# Patient Record
Sex: Female | Born: 1985 | Race: White | Hispanic: No | Marital: Married | Smoking: Never smoker
Health system: Southern US, Community
[De-identification: ages and names within clinical notes are randomized; demographics above are authoritative.]

## PROBLEM LIST (undated history)

## (undated) DIAGNOSIS — C439 Malignant melanoma of skin, unspecified: Secondary | ICD-10-CM

## (undated) DIAGNOSIS — J310 Chronic rhinitis: Secondary | ICD-10-CM

## (undated) DIAGNOSIS — K52832 Lymphocytic colitis: Secondary | ICD-10-CM

## (undated) HISTORY — DX: Chronic rhinitis: J31.0

## (undated) HISTORY — PX: MELANOMA EXCISION: SHX5266

## (undated) HISTORY — DX: Lymphocytic colitis: K52.832

## (undated) HISTORY — PX: DENTAL SURGERY: SHX609

## (undated) HISTORY — DX: Malignant melanoma of skin, unspecified: C43.9

---

## 2021-02-28 LAB — OB RESULTS CONSOLE ABO/RH: RH Type: POSITIVE

## 2021-02-28 LAB — OB RESULTS CONSOLE HEPATITIS B SURFACE ANTIGEN: Hepatitis B Surface Ag: NEGATIVE

## 2021-02-28 LAB — OB RESULTS CONSOLE RUBELLA ANTIBODY, IGM: Rubella: IMMUNE

## 2021-02-28 LAB — HEPATITIS C ANTIBODY: HCV Ab: NEGATIVE

## 2021-02-28 LAB — OB RESULTS CONSOLE ANTIBODY SCREEN: Antibody Screen: NEGATIVE

## 2021-02-28 LAB — OB RESULTS CONSOLE RPR: RPR: NONREACTIVE

## 2021-02-28 LAB — OB RESULTS CONSOLE HIV ANTIBODY (ROUTINE TESTING): HIV: NONREACTIVE

## 2021-03-03 ENCOUNTER — Inpatient Hospital Stay (HOSPITAL_COMMUNITY)
Admission: AD | Admit: 2021-03-03 | Payer: BC Managed Care – PPO | Source: Home / Self Care | Admitting: Obstetrics and Gynecology

## 2021-03-08 LAB — OB RESULTS CONSOLE GC/CHLAMYDIA
Chlamydia: NEGATIVE
Neisseria Gonorrhea: NEGATIVE

## 2021-03-26 NOTE — L&D Delivery Note (Signed)
Delivery Note At 10:14 AM a viable female was delivered via ROA Presentation Apgars 9 and 9  Placenta status:spontaneously with 3 vessel cord   ,  .  Cord:   with the following complications:  loose nuchal cord x 1 .  Cord pH: not obtained Placenta sent to pathology Anesthesia: Epidural Episiotomy: None Lacerations: 2nd  Suture Repair: 3.0 chromic Est. Blood Loss (mL):  300  Mom to postpartum.  Baby to Couplet care / Skin to Skin.  Cyril Mourning 10/01/2021, 10:30 AM

## 2021-05-19 ENCOUNTER — Other Ambulatory Visit: Payer: Self-pay | Admitting: Obstetrics & Gynecology

## 2021-05-19 DIAGNOSIS — Z361 Encounter for antenatal screening for raised alphafetoprotein level: Secondary | ICD-10-CM

## 2021-05-19 DIAGNOSIS — Z363 Encounter for antenatal screening for malformations: Secondary | ICD-10-CM

## 2021-05-25 ENCOUNTER — Other Ambulatory Visit: Payer: Self-pay

## 2021-05-31 ENCOUNTER — Encounter: Payer: Self-pay | Admitting: *Deleted

## 2021-05-31 ENCOUNTER — Other Ambulatory Visit: Payer: Self-pay

## 2021-06-06 ENCOUNTER — Ambulatory Visit: Payer: BC Managed Care – PPO | Attending: Obstetrics & Gynecology

## 2021-06-06 ENCOUNTER — Ambulatory Visit: Payer: BC Managed Care – PPO | Attending: Maternal & Fetal Medicine | Admitting: Maternal & Fetal Medicine

## 2021-06-06 ENCOUNTER — Encounter: Payer: Self-pay | Admitting: *Deleted

## 2021-06-06 ENCOUNTER — Other Ambulatory Visit: Payer: Self-pay

## 2021-06-06 ENCOUNTER — Ambulatory Visit: Payer: BC Managed Care – PPO | Admitting: *Deleted

## 2021-06-06 VITALS — BP 118/65 | HR 84 | Ht 67.0 in

## 2021-06-06 DIAGNOSIS — Z361 Encounter for antenatal screening for raised alphafetoprotein level: Secondary | ICD-10-CM | POA: Diagnosis present

## 2021-06-06 DIAGNOSIS — R9389 Abnormal findings on diagnostic imaging of other specified body structures: Secondary | ICD-10-CM | POA: Diagnosis not present

## 2021-06-06 DIAGNOSIS — Z363 Encounter for antenatal screening for malformations: Secondary | ICD-10-CM | POA: Diagnosis present

## 2021-06-06 DIAGNOSIS — O283 Abnormal ultrasonic finding on antenatal screening of mother: Secondary | ICD-10-CM

## 2021-06-06 NOTE — Progress Notes (Signed)
MFM Brief Note ? ?Sarah Mccall is a 36 yo G2P1 at 23w 2d based on an EDD of 10/01/21 consistent with LMP and 8 week scan.  She is being seen today at the request of Dr. Linda Hedges regarding ultrasound findings performed in her offices. ? ?She is overall doing well without s/sx of preterm labor or preeclampsia. ? ?Single intrauterine pregnancy here for a detailed anatomy due to an outside exam observing frontal bossing and choroid plexus cyst. ?Normal anatomy with measurements consistent with dates ?There is good fetal movement and amniotic fluid volume ?Suboptimal views of the fetal anatomy were obtained secondary to fetal position. ? ?I met with Sarah Mccall and reviewed the normal nature of today's exam. ? ?She has a low risk NIPS and AFP/Horizon. ? ?I explained that we did not observe a choroid plexus cyst nor did we observe frontal bossing. We also cleared the ductus arteriosus.  ? ?I reviewed the association with frontal bossing to include skeletal dysplasia. I discussed the characteristics of lethal and non-lethal skeletal dysplasia, which include shortened long bones, small chest and possible frontal bossing and fetal growth restriction. ? ?Again we did not observe these findings today. We observed a normal profile, normal long bones, chest and normal growth. ? ?At this time no additional follow up is recommended. ? ?All questions answered.  ? ?I spent 20 minutes with > 50% in face to face consultation. ? ?Vikki Ports, MD ?

## 2021-08-31 LAB — OB RESULTS CONSOLE GBS: GBS: POSITIVE

## 2021-09-07 ENCOUNTER — Telehealth (HOSPITAL_COMMUNITY): Payer: Self-pay | Admitting: *Deleted

## 2021-09-07 NOTE — Telephone Encounter (Signed)
Preadmission screen  

## 2021-09-08 ENCOUNTER — Telehealth (HOSPITAL_COMMUNITY): Payer: Self-pay | Admitting: *Deleted

## 2021-09-08 ENCOUNTER — Encounter (HOSPITAL_COMMUNITY): Payer: Self-pay | Admitting: *Deleted

## 2021-09-08 NOTE — Telephone Encounter (Signed)
Preadmission screen  

## 2021-09-30 ENCOUNTER — Encounter (HOSPITAL_COMMUNITY): Payer: Self-pay

## 2021-09-30 ENCOUNTER — Inpatient Hospital Stay (HOSPITAL_COMMUNITY): Payer: BC Managed Care – PPO

## 2021-10-01 ENCOUNTER — Inpatient Hospital Stay (HOSPITAL_COMMUNITY)
Admission: AD | Admit: 2021-10-01 | Discharge: 2021-10-02 | DRG: 807 | Disposition: A | Discharge status: Home / Self Care | Payer: BC Managed Care – PPO | Attending: Obstetrics and Gynecology | Admitting: Obstetrics and Gynecology

## 2021-10-01 ENCOUNTER — Other Ambulatory Visit: Payer: Self-pay

## 2021-10-01 ENCOUNTER — Inpatient Hospital Stay (HOSPITAL_COMMUNITY): Payer: BC Managed Care – PPO | Admitting: Anesthesiology

## 2021-10-01 ENCOUNTER — Encounter (HOSPITAL_COMMUNITY): Payer: Self-pay | Admitting: Obstetrics and Gynecology

## 2021-10-01 DIAGNOSIS — Z3A40 40 weeks gestation of pregnancy: Secondary | ICD-10-CM

## 2021-10-01 DIAGNOSIS — O99824 Streptococcus B carrier state complicating childbirth: Secondary | ICD-10-CM | POA: Diagnosis present

## 2021-10-01 DIAGNOSIS — Z3490 Encounter for supervision of normal pregnancy, unspecified, unspecified trimester: Principal | ICD-10-CM

## 2021-10-01 DIAGNOSIS — O26893 Other specified pregnancy related conditions, third trimester: Secondary | ICD-10-CM | POA: Diagnosis present

## 2021-10-01 LAB — CBC
HCT: 39.8 % (ref 36.0–46.0)
Hemoglobin: 13.5 g/dL (ref 12.0–15.0)
MCH: 31.9 pg (ref 26.0–34.0)
MCHC: 33.9 g/dL (ref 30.0–36.0)
MCV: 94.1 fL (ref 80.0–100.0)
Platelets: 194 10*3/uL (ref 150–400)
RBC: 4.23 MIL/uL (ref 3.87–5.11)
RDW: 13.3 % (ref 11.5–15.5)
WBC: 11.3 10*3/uL — ABNORMAL HIGH (ref 4.0–10.5)
nRBC: 0 % (ref 0.0–0.2)

## 2021-10-01 LAB — TYPE AND SCREEN
ABO/RH(D): O POS
Antibody Screen: NEGATIVE

## 2021-10-01 MED ORDER — SODIUM CHLORIDE 0.9 % IV SOLN
5.0000 10*6.[IU] | Freq: Once | INTRAVENOUS | Status: AC
  Administered 2021-10-01: 5 10*6.[IU] via INTRAVENOUS
  Filled 2021-10-01: qty 5

## 2021-10-01 MED ORDER — SIMETHICONE 80 MG PO CHEW: 80.0000 mg | CHEWABLE_TABLET | ORAL | Status: DC | PRN

## 2021-10-01 MED ORDER — BUPIVACAINE HCL (PF) 0.25 % IJ SOLN
INTRAMUSCULAR | Status: DC | PRN
  Administered 2021-10-01: 10 mL via EPIDURAL

## 2021-10-01 MED ORDER — DIPHENHYDRAMINE HCL 50 MG/ML IJ SOLN: 12.5000 mg | INTRAMUSCULAR | Status: DC | PRN

## 2021-10-01 MED ORDER — ONDANSETRON HCL 4 MG/2ML IJ SOLN
4.0000 mg | Freq: Four times a day (QID) | INTRAMUSCULAR | Status: DC | PRN
  Administered 2021-10-01: 4 mg via INTRAVENOUS
  Filled 2021-10-01: qty 2

## 2021-10-01 MED ORDER — SOD CITRATE-CITRIC ACID 500-334 MG/5ML PO SOLN: 30.0000 mL | ORAL | Status: DC | PRN

## 2021-10-01 MED ORDER — FENTANYL CITRATE (PF) 100 MCG/2ML IJ SOLN
INTRAMUSCULAR | Status: AC
  Filled 2021-10-01: qty 2

## 2021-10-01 MED ORDER — MISOPROSTOL 25 MCG QUARTER TABLET: 25.0000 ug | ORAL_TABLET | ORAL | Status: DC | PRN

## 2021-10-01 MED ORDER — FLEET ENEMA 7-19 GM/118ML RE ENEM: 1.0000 | ENEMA | Freq: Every day | RECTAL | Status: DC | PRN

## 2021-10-01 MED ORDER — LACTATED RINGERS IV SOLN: 500.0000 mL | INTRAVENOUS | Status: DC | PRN

## 2021-10-01 MED ORDER — OXYTOCIN BOLUS FROM INFUSION
333.0000 mL | Freq: Once | INTRAVENOUS | Status: AC
  Administered 2021-10-01: 333 mL via INTRAVENOUS

## 2021-10-01 MED ORDER — DIBUCAINE (PERIANAL) 1 % EX OINT: 1.0000 | TOPICAL_OINTMENT | CUTANEOUS | Status: DC | PRN

## 2021-10-01 MED ORDER — LIDOCAINE HCL (PF) 1 % IJ SOLN
INTRAMUSCULAR | Status: DC | PRN
  Administered 2021-10-01: 2 mL via EPIDURAL
  Administered 2021-10-01: 10 mL via EPIDURAL

## 2021-10-01 MED ORDER — COCONUT OIL OIL: 1.0000 | TOPICAL_OIL | Status: DC | PRN

## 2021-10-01 MED ORDER — TERBUTALINE SULFATE 1 MG/ML IJ SOLN: 0.2500 mg | Freq: Once | INTRAMUSCULAR | Status: DC | PRN

## 2021-10-01 MED ORDER — OXYTOCIN-SODIUM CHLORIDE 30-0.9 UT/500ML-% IV SOLN
2.5000 [IU]/h | INTRAVENOUS | Status: DC
Start: 2021-10-01 — End: 2021-10-01
  Administered 2021-10-01: 2.5 [IU]/h via INTRAVENOUS
  Filled 2021-10-01: qty 500

## 2021-10-01 MED ORDER — LACTATED RINGERS IV SOLN
500.0000 mL | Freq: Once | INTRAVENOUS | Status: AC
Start: 2021-10-01 — End: 2021-10-01
  Administered 2021-10-01: 500 mL via INTRAVENOUS

## 2021-10-01 MED ORDER — BENZOCAINE-MENTHOL 20-0.5 % EX AERO
1.0000 | INHALATION_SPRAY | CUTANEOUS | Status: DC | PRN
  Administered 2021-10-02: 1 via TOPICAL
  Filled 2021-10-01: qty 56

## 2021-10-01 MED ORDER — OXYCODONE-ACETAMINOPHEN 5-325 MG PO TABS: 1.0000 | ORAL_TABLET | ORAL | Status: DC | PRN

## 2021-10-01 MED ORDER — DIPHENHYDRAMINE HCL 25 MG PO CAPS: 25.0000 mg | ORAL_CAPSULE | Freq: Four times a day (QID) | ORAL | Status: DC | PRN

## 2021-10-01 MED ORDER — WITCH HAZEL-GLYCERIN EX PADS: 1.0000 | MEDICATED_PAD | CUTANEOUS | Status: DC | PRN

## 2021-10-01 MED ORDER — PHENYLEPHRINE 80 MCG/ML (10ML) SYRINGE FOR IV PUSH (FOR BLOOD PRESSURE SUPPORT): 80.0000 ug | PREFILLED_SYRINGE | INTRAVENOUS | Status: DC | PRN

## 2021-10-01 MED ORDER — EPHEDRINE 5 MG/ML INJ: 10.0000 mg | INTRAVENOUS | Status: DC | PRN

## 2021-10-01 MED ORDER — ACETAMINOPHEN 325 MG PO TABS
650.0000 mg | ORAL_TABLET | ORAL | Status: DC | PRN
Start: 2021-10-01 — End: 2021-10-01

## 2021-10-01 MED ORDER — OXYTOCIN-SODIUM CHLORIDE 30-0.9 UT/500ML-% IV SOLN: 1.0000 m[IU]/min | INTRAVENOUS | Status: DC

## 2021-10-01 MED ORDER — TETANUS-DIPHTH-ACELL PERTUSSIS 5-2.5-18.5 LF-MCG/0.5 IM SUSY: 0.5000 mL | PREFILLED_SYRINGE | Freq: Once | INTRAMUSCULAR | Status: DC

## 2021-10-01 MED ORDER — IBUPROFEN 600 MG PO TABS
600.0000 mg | ORAL_TABLET | Freq: Four times a day (QID) | ORAL | Status: DC
  Administered 2021-10-01 – 2021-10-02 (×5): 600 mg via ORAL
  Filled 2021-10-01 (×5): qty 1

## 2021-10-01 MED ORDER — SENNOSIDES-DOCUSATE SODIUM 8.6-50 MG PO TABS
2.0000 | ORAL_TABLET | Freq: Every day | ORAL | Status: DC
  Administered 2021-10-02: 2 via ORAL
  Filled 2021-10-01: qty 2

## 2021-10-01 MED ORDER — BISACODYL 10 MG RE SUPP: 10.0000 mg | Freq: Every day | RECTAL | Status: DC | PRN

## 2021-10-01 MED ORDER — PENICILLIN G POT IN DEXTROSE 60000 UNIT/ML IV SOLN
3.0000 10*6.[IU] | INTRAVENOUS | Status: DC
  Administered 2021-10-01: 3 10*6.[IU] via INTRAVENOUS
  Filled 2021-10-01: qty 50

## 2021-10-01 MED ORDER — PRENATAL MULTIVITAMIN CH
1.0000 | ORAL_TABLET | Freq: Every day | ORAL | Status: DC
  Administered 2021-10-01 – 2021-10-02 (×2): 1 via ORAL
  Filled 2021-10-01 (×2): qty 1

## 2021-10-01 MED ORDER — ONDANSETRON HCL 4 MG/2ML IJ SOLN: 4.0000 mg | INTRAMUSCULAR | Status: DC | PRN

## 2021-10-01 MED ORDER — ONDANSETRON HCL 4 MG PO TABS: 4.0000 mg | ORAL_TABLET | ORAL | Status: DC | PRN

## 2021-10-01 MED ORDER — ZOLPIDEM TARTRATE 5 MG PO TABS: 5.0000 mg | ORAL_TABLET | Freq: Every evening | ORAL | Status: DC | PRN

## 2021-10-01 MED ORDER — MEDROXYPROGESTERONE ACETATE 150 MG/ML IM SUSP: 150.0000 mg | INTRAMUSCULAR | Status: DC | PRN

## 2021-10-01 MED ORDER — LIDOCAINE HCL (PF) 1 % IJ SOLN: 30.0000 mL | INTRAMUSCULAR | Status: DC | PRN

## 2021-10-01 MED ORDER — FENTANYL-BUPIVACAINE-NACL 0.5-0.125-0.9 MG/250ML-% EP SOLN
12.0000 mL/h | EPIDURAL | Status: DC | PRN
  Administered 2021-10-01: 12 mL/h via EPIDURAL
  Filled 2021-10-01: qty 250

## 2021-10-01 MED ORDER — FENTANYL CITRATE (PF) 100 MCG/2ML IJ SOLN
INTRAMUSCULAR | Status: DC | PRN
Start: 2021-10-01 — End: 2021-10-01
  Administered 2021-10-01: 100 ug via INTRAVENOUS

## 2021-10-01 MED ORDER — ACETAMINOPHEN 325 MG PO TABS
650.0000 mg | ORAL_TABLET | ORAL | Status: DC | PRN
  Administered 2021-10-02 (×3): 650 mg via ORAL
  Filled 2021-10-01 (×3): qty 2

## 2021-10-01 MED ORDER — LACTATED RINGERS IV SOLN: INTRAVENOUS | Status: DC

## 2021-10-01 MED ORDER — MEASLES, MUMPS & RUBELLA VAC IJ SOLR: 0.5000 mL | Freq: Once | INTRAMUSCULAR | Status: DC

## 2021-10-01 MED ORDER — OXYCODONE-ACETAMINOPHEN 5-325 MG PO TABS: 2.0000 | ORAL_TABLET | ORAL | Status: DC | PRN

## 2021-10-01 NOTE — Anesthesia Procedure Notes (Signed)
Epidural Patient location during procedure: OB Start time: 10/01/2021 7:33 AM End time: 10/01/2021 7:46 AM  Staffing Anesthesiologist: Pervis Hocking, DO Performed: anesthesiologist   Preanesthetic Checklist Completed: patient identified, IV checked, risks and benefits discussed, monitors and equipment checked, pre-op evaluation and timeout performed  Epidural Patient position: sitting Prep: DuraPrep and site prepped and draped Patient monitoring: continuous pulse ox, blood pressure, heart rate and cardiac monitor Approach: midline Location: L3-L4 Injection technique: LOR air  Needle:  Needle type: Tuohy  Needle gauge: 17 G Needle length: 9 cm Needle insertion depth: 5 cm Catheter type: closed end flexible Catheter size: 19 Gauge Catheter at skin depth: 10 cm Test dose: negative  Assessment Sensory level: T8 Events: blood not aspirated, injection not painful, no injection resistance, no paresthesia and negative IV test  Additional Notes Patient identified. Risks/Benefits/Options discussed with patient including but not limited to bleeding, infection, nerve damage, paralysis, failed block, incomplete pain control, headache, blood pressure changes, nausea, vomiting, reactions to medication both or allergic, itching and postpartum back pain. Confirmed with bedside nurse the patient's most recent platelet count. Confirmed with patient that they are not currently taking any anticoagulation, have any bleeding history or any family history of bleeding disorders. Patient expressed understanding and wished to proceed. All questions were answered. Sterile technique was used throughout the entire procedure. Please see nursing notes for vital signs. Test dose was given through epidural catheter and negative prior to continuing to dose epidural or start infusion. Warning signs of high block given to the patient including shortness of breath, tingling/numbness in hands, complete motor block,  or any concerning symptoms with instructions to call for help. Patient was given instructions on fall risk and not to get out of bed. All questions and concerns addressed with instructions to call with any issues or inadequate analgesia.  Reason for block:procedure for pain

## 2021-10-01 NOTE — Lactation Note (Signed)
This note was copied from a baby's chart. Lactation Consultation Note  Patient Name: Boy Dexter Sauser [id number] Date: 10/01/2021 Reason for consult: Initial assessment;Term Age:35 hours  LC in to visit with P2 Mom of term baby.  Baby "[name]" swaddled and quiet, alert in crib.  Education provided for Mom and FOB.  Baby started showing feeding cues.  Offered to assist with positioning and latching.  Mom had been using cross cradle hold, but offered to assist with football hold.  Baby latched easily with one try.  Showed FOB how to assess lower lip for flanging and wide gape of mouth.   Mom taught to use alternate breast compression and baby had identifiable swallowing.  Encouraged STS as much as possible and offering the breast with feeding cues, goal is >8 times/attempts per 24 hrs.  Encouraged Mom to call prn for assistance or concerns  Maternal Data Has patient been taught Hand Expression?: Yes Does the patient have breastfeeding experience prior to this delivery?: Yes How long did the patient breastfeed?: 6 months  Feeding Mother's Current Feeding Choice: Breast Milk  LATCH Score Latch: Grasps breast easily, tongue down, lips flanged, rhythmical sucking.  Audible Swallowing: Spontaneous and intermittent  Type of Nipple: Everted at rest and after stimulation  Comfort (Breast/Nipple): Soft / non-tender  Hold (Positioning): Assistance needed to correctly position infant at breast and maintain latch.  LATCH Score: 9   Interventions Interventions: Breast feeding basics reviewed;Assisted with latch;Skin to skin;Breast massage;Hand express;Breast compression;Adjust position;Support pillows;Position options;LC Services brochure  Discharge Pump: Employee Pump ([employer]) WIC Program: No  Consult Status Consult Status: Follow-up Date: 10/02/21 Follow-up type: In-patient    Broadus John RN [place] 10/01/2021, 3:15 PM

## 2021-10-01 NOTE — H&P (Signed)
Sarah Mccall is a 36 year old G 2 P 1001 at 40 weeks presenting for IOL. History of rapid labor last time. OB History     Gravida  2   Para  1   Term  1   Preterm      AB      Living  1      SAB      IAB      Ectopic      Multiple      Live Births  1          Past Medical History:  Diagnosis Date   Lymphocytic colitis    Melanoma ([place])    Rhinitis    Past Surgical History:  Procedure Laterality Date   DENTAL SURGERY     Dental implants   MELANOMA EXCISION     Family History: family history includes Hypertension in her father. Social History:  reports that she has never smoked. She has never used smokeless tobacco. She reports that she does not currently use alcohol. She reports that she does not use drugs.     Maternal Diabetes: No Genetic Screening: Normal Maternal Ultrasounds/Referrals: Normal Fetal Ultrasounds or other Referrals:  None Maternal Substance Abuse:  No Significant Maternal Medications:  None Significant Maternal Lab Results:  Group B Strep positive Other Comments:  None  Review of Systems  All other systems reviewed and are negative.  Maternal Medical History:  Prenatal complications: no prenatal complications   Dilation: 3.5 Effacement (%): 80 Station: -1 Exam by:: Dr Helane Rima Height '5\' 7"'$  (1.702 m), weight 84.1 kg, last menstrual period 12/25/2020. Maternal Exam:  Uterine Assessment: Contraction strength is mild.  Contraction frequency is irregular.  Abdomen: Fetal presentation: vertex   Fetal Exam Fetal State Assessment: Category I - tracings are normal.   Physical Exam Vitals and nursing note reviewed. Exam conducted with a chaperone present.  Constitutional:      Appearance: Normal appearance.  Cardiovascular:     Rate and Rhythm: Normal rate and regular rhythm.  Pulmonary:     Effort: Pulmonary effort is normal.  Neurological:     Mental Status: She is alert.     Prenatal labs: ABO, Rh: O/Positive/--  (12/06 0000) Antibody: Negative (12/06 0000) Rubella: Immune (12/06 0000) RPR: Nonreactive (12/06 0000)  HBsAg: Negative (12/06 0000)  HIV: Non-reactive (12/06 0000)  GBS:     Assessment/Plan: IUP at term IOL GBBS + Epidural Antibiotics Send placenta to pathology Anticipate NSVD   Cyril Mourning 10/01/2021, 7:02 AM

## 2021-10-01 NOTE — Lactation Note (Signed)
This note was copied from a baby's chart. Lactation Consultation Note  Patient Name: Sarah Mccall [id number] Date: 10/01/2021 Reason for consult: L&D Initial assessment;Term Age:35 hours   Initial L&D Consult:  Visited with family < 1 hour after birth 63 awake and alert; congestion noted.  Assisted to latch easily.  Observed him feeding on/off for 10 minutes prior to leaving the room.  Reassured parents that lactation services will be available on the M/B unit.  Father and one other family member present.   Maternal Data    Feeding Mother's Current Feeding Choice: Breast Milk  LATCH Score Latch: Repeated attempts needed to sustain latch, nipple held in mouth throughout feeding, stimulation needed to elicit sucking reflex.  Audible Swallowing: None  Type of Nipple: Everted at rest and after stimulation  Comfort (Breast/Nipple): Soft / non-tender  Hold (Positioning): Assistance needed to correctly position infant at breast and maintain latch.  LATCH Score: 6   Lactation Tools Discussed/Used    Interventions Interventions: Assisted with latch;Skin to skin  Discharge    Consult Status Consult Status: Follow-up from L&D Follow-up type: In-patient    Sarah Mccall 10/01/2021, 10:56 AM

## 2021-10-01 NOTE — Anesthesia Preprocedure Evaluation (Signed)
Anesthesia Evaluation  Patient identified by MRN, date of birth, ID band Patient awake    Reviewed: Allergy & Precautions, Patient's Chart, lab work & pertinent test results  Airway Mallampati: II  TM Distance: >3 FB Neck ROM: Full    Dental no notable dental hx.    Pulmonary neg pulmonary ROS,    Pulmonary exam normal breath sounds clear to auscultation       Cardiovascular negative cardio ROS Normal cardiovascular exam Rhythm:Regular Rate:Normal     Neuro/Psych negative neurological ROS  negative psych ROS   GI/Hepatic negative GI ROS, Neg liver ROS,   Endo/Other  negative endocrine ROS  Renal/GU negative Renal ROS  negative genitourinary   Musculoskeletal negative musculoskeletal ROS (+)   Abdominal   Peds negative pediatric ROS (+)  Hematology negative hematology ROS (+) Hb 13.5, plt 194   Anesthesia Other Findings   Reproductive/Obstetrics (+) Pregnancy                             Anesthesia Physical Anesthesia Plan  ASA: 2  Anesthesia Plan: Epidural   Post-op Pain Management:    Induction:   PONV Risk Score and Plan: 2  Airway Management Planned: Natural Airway  Additional Equipment: None  Intra-op Plan:   Post-operative Plan:   Informed Consent: I have reviewed the patients History and Physical, chart, labs and discussed the procedure including the risks, benefits and alternatives for the proposed anesthesia with the patient or authorized representative who has indicated his/her understanding and acceptance.       Plan Discussed with:   Anesthesia Plan Comments:         Anesthesia Quick Evaluation

## 2021-10-02 ENCOUNTER — Ambulatory Visit: Payer: Self-pay

## 2021-10-02 LAB — CBC
HCT: 38.9 % (ref 36.0–46.0)
Hemoglobin: 13.1 g/dL (ref 12.0–15.0)
MCH: 32.6 pg (ref 26.0–34.0)
MCHC: 33.7 g/dL (ref 30.0–36.0)
MCV: 96.8 fL (ref 80.0–100.0)
Platelets: 167 10*3/uL (ref 150–400)
RBC: 4.02 MIL/uL (ref 3.87–5.11)
RDW: 13.5 % (ref 11.5–15.5)
WBC: 16.8 10*3/uL — ABNORMAL HIGH (ref 4.0–10.5)
nRBC: 0 % (ref 0.0–0.2)

## 2021-10-02 LAB — RPR: RPR Ser Ql: NONREACTIVE

## 2021-10-02 NOTE — Lactation Note (Signed)
This note was copied from a baby's chart. Lactation Consultation Note  Patient Name: Boy Gustie Bobb [id number] Date: 10/02/2021 Reason for consult: Follow-up assessment;Term;Nipple pain/trauma Age:35 hours  LC in to visit with P2 Mom of term baby.  Mom has been exclusively breast feeding baby and he is at a 5.6% weight loss.  Baby's output is great.  Bilirubin low.  Mom reports baby is latching and feeding well.  Mom wanting assistance with feeding baby cross cradle hold on left breast due to some soreness.  Mom had been using a C hold on her breast.  Assisted Mom in using a U hold and baby latched deeply and easily.  Mom aware of how to assess for flanging of lower lips.  Baby noted to be  nutritive and contented.  Encouraged to keep baby STS and offer the breast often with cues. Mom to call for assistance prn.    LATCH Score Latch: Grasps breast easily, tongue down, lips flanged, rhythmical sucking.  Audible Swallowing: Spontaneous and intermittent  Type of Nipple: Everted at rest and after stimulation  Comfort (Breast/Nipple): Filling, red/small blisters or bruises, mild/mod discomfort (left breast slightly sore)  Hold (Positioning): Assistance needed to correctly position infant at breast and maintain latch.  LATCH Score: 8   Interventions Interventions: Breast feeding basics reviewed;Assisted with latch;Skin to skin;Breast massage;Adjust position;Support pillows;Breast compression;Position options   Consult Status Consult Status: Follow-up Date: 10/03/21 Follow-up type: In-patient    Broadus John 10/02/2021, 2:56 PM

## 2021-10-02 NOTE — Discharge Summary (Signed)
Postpartum Discharge Summary      Patient Name: Sarah Mccall DOB: 12-Feb-1986 MRN: [id number]  Date of admission: 10/01/2021 Delivery date:10/01/2021  Delivering provider: Dian Queen  Date of discharge: 10/02/2021  Admitting diagnosis: Pregnancy [Z34.90] NSVD (normal spontaneous vaginal delivery) [O80] Intrauterine pregnancy: [redacted]w[redacted]d    Secondary diagnosis:  Principal Problem:   Pregnancy Active Problems:   NSVD (normal spontaneous vaginal delivery)  Additional problems:      Discharge diagnosis: Term Pregnancy Delivered                                              Post partum procedures:   Augmentation: AROM Complications: None  Hospital course: Induction of Labor With Vaginal Delivery   36y.o. yo G2P2002 at 448w0das admitted to the hospital 10/01/2021 for induction of labor.  Indication for induction:  history of rapid labor .  Patient had an uncomplicated labor course as follows: Membrane Rupture Time/Date: 6:47 AM ,10/01/2021   Delivery Method:Vaginal, Spontaneous  Episiotomy: None  Lacerations:  2nd degree  Details of delivery can be found in separate delivery note.  Patient had a routine postpartum course. Patient is discharged home 10/02/21.  Newborn Data: Birth date:10/01/2021  Birth time:10:14 AM  Gender:Female  Living status:Living  Apgars:7 ,9  Weight:3550 g   Magnesium Sulfate received: No BMZ received: No Rhophylac:N/A MMR:N/A T-DaP:Given prenatally Flu: N/A Transfusion:No  Physical exam  Vitals:   10/01/21 1720 10/01/21 2033 10/02/21 0054 10/02/21 0535  BP: 109/70 106/66 114/66 116/78  Pulse: 79 87 82 69  Resp: 20 20 18 18   Temp: 98.2 F (36.8 C) 98.5 F (36.9 C) (!) 97.4 F (36.3 C) 97.7 F (36.5 C)  TempSrc: Oral Oral Oral Oral  SpO2: 98% 96% 96% 98%  Weight:      Height:       General: alert, cooperative, and no distress Lochia: appropriate Uterine Fundus: firm Incision:   DVT Evaluation: No evidence of DVT seen on physical  exam. Labs: Lab Results  Component Value Date   WBC 16.8 (H) 10/02/2021   HGB 13.1 10/02/2021   HCT 38.9 10/02/2021   MCV 96.8 10/02/2021   PLT 167 10/02/2021       No data to display         EdLesothocore:    10/02/2021    9:15 AM  Edinburgh Postnatal Depression Scale Screening Tool  I have been able to laugh and see the funny side of things. 0  I have looked forward with enjoyment to things. 0  I have blamed myself unnecessarily when things went wrong. 2  I have been anxious or worried for no good reason. 2  I have felt scared or panicky for no good reason. 2  Things have been getting on top of me. 0  I have been so unhappy that I have had difficulty sleeping. 0  I have felt sad or miserable. 1  I have been so unhappy that I have been crying. 1  The thought of harming myself has occurred to me. 0  Edinburgh Postnatal Depression Scale Total 8      After visit meds:  Allergies as of 10/02/2021       Reactions   Phenergan [promethazine Hcl] Anaphylaxis        Medication List     TAKE these medications  PRENATAL VITAMINS PO Take by mouth.         Discharge home in stable condition Infant Feeding: Breast Infant Disposition:home with mother Discharge instruction: per After Visit Summary and Postpartum booklet. Activity: Advance as tolerated. Pelvic rest for 6 weeks.  Diet: routine diet Anticipated Birth Control: Unsure Postpartum Appointment:6 weeks Additional Postpartum F/U:    Future Appointments:No future appointments. Follow up Visit:      10/02/2021 Luz Lex, MD

## 2021-10-02 NOTE — Anesthesia Postprocedure Evaluation (Signed)
Anesthesia Post Note  Patient: Sarah Mccall  Procedure(s) Performed: AN AD HOC LABOR EPIDURAL     Patient location during evaluation: Mother Baby Anesthesia Type: Epidural Level of consciousness: awake Pain management: satisfactory to patient Vital Signs Assessment: post-procedure vital signs reviewed and stable Respiratory status: spontaneous breathing Cardiovascular status: stable Anesthetic complications: no   No notable events documented.  Last Vitals:  Vitals:   10/02/21 0054 10/02/21 0535  BP: 114/66 116/78  Pulse: 82 69  Resp: 18 18  Temp: (!) 36.3 C 36.5 C  SpO2: 96% 98%    Last Pain:  Vitals:   10/02/21 0628  TempSrc:   PainSc: 6    Pain Goal: Patients Stated Pain Goal: 2 (10/02/21 3606)                 Casimer Lanius

## 2021-10-03 ENCOUNTER — Ambulatory Visit: Payer: Self-pay

## 2021-10-03 LAB — SURGICAL PATHOLOGY

## 2021-10-03 NOTE — Lactation Note (Signed)
This note was copied from a baby's chart. Lactation Consultation Note  Patient Name: Sarah Mccall [id number] Date: 10/03/2021 Reason for consult: Follow-up assessment;Term Age:34 hours   P2 mother whose infant is now 23 hours old.  This is a term baby at 40+0 weeks.  Mother's current feeding preference is breast.  Mother had just latched "[name]" prior to my arrival; questioning whether his latch looked good.  He appeared to have a good latch with a wide gape and flanged lips; no discomfort with the exception of the initial sensitivity when latching.  Observed him feeding with swallows for 10 minutes.  Mother burped and he began awakening more; latched to the alternate breast easily.  He continued to feed as I left the room.  Reviewed breast feeding basics.  Mother will call for latch assistance as needed.  Family has our OP phone number for any concerns after discharge.   Maternal Data    Feeding Mother's Current Feeding Choice: Breast Milk  LATCH Score Latch: Grasps breast easily, tongue down, lips flanged, rhythmical sucking.  Audible Swallowing: Spontaneous and intermittent  Type of Nipple: Everted at rest and after stimulation  Comfort (Breast/Nipple): Filling, red/small blisters or bruises, mild/mod discomfort  Hold (Positioning): Assistance needed to correctly position infant at breast and maintain latch.  LATCH Score: 8   Lactation Tools Discussed/Used    Interventions Interventions: Breast feeding basics reviewed;Assisted with latch;Skin to skin;Breast compression;Expressed milk;Position options;Support pillows;Adjust position;Education  Discharge Discharge Education: Engorgement and breast care Pump: Personal  Consult Status Consult Status: Complete Date: 10/03/21 Follow-up type: Call as needed    Thales Knipple R Mariesha Venturella 10/03/2021, 8:11 AM

## 2021-10-10 ENCOUNTER — Telehealth (HOSPITAL_COMMUNITY): Payer: Self-pay | Admitting: *Deleted

## 2021-10-10 NOTE — Telephone Encounter (Signed)
Left phone voicemail message.  Odis Hollingshead, RN 10-10-2021 at 10:51am

## 2022-01-01 ENCOUNTER — Encounter: Payer: Self-pay | Admitting: Gastroenterology

## 2022-01-01 ENCOUNTER — Ambulatory Visit (INDEPENDENT_AMBULATORY_CARE_PROVIDER_SITE_OTHER): Payer: BC Managed Care – PPO | Admitting: Gastroenterology

## 2022-01-01 ENCOUNTER — Other Ambulatory Visit (INDEPENDENT_AMBULATORY_CARE_PROVIDER_SITE_OTHER): Payer: BC Managed Care – PPO

## 2022-01-01 VITALS — BP 100/78 | HR 75 | Ht 67.0 in | Wt 171.4 lb

## 2022-01-01 DIAGNOSIS — K529 Noninfective gastroenteritis and colitis, unspecified: Secondary | ICD-10-CM | POA: Diagnosis not present

## 2022-01-01 DIAGNOSIS — R140 Abdominal distension (gaseous): Secondary | ICD-10-CM

## 2022-01-01 DIAGNOSIS — R12 Heartburn: Secondary | ICD-10-CM | POA: Diagnosis not present

## 2022-01-01 DIAGNOSIS — R198 Other specified symptoms and signs involving the digestive system and abdomen: Secondary | ICD-10-CM

## 2022-01-01 LAB — CBC WITH DIFFERENTIAL/PLATELET
Basophils Absolute: 0 10*3/uL (ref 0.0–0.1)
Basophils Relative: 0.3 % (ref 0.0–3.0)
Eosinophils Absolute: 0.2 10*3/uL (ref 0.0–0.7)
Eosinophils Relative: 2.3 % (ref 0.0–5.0)
HCT: 40.3 % (ref 36.0–46.0)
Hemoglobin: 13.6 g/dL (ref 12.0–15.0)
Lymphocytes Relative: 25.9 % (ref 12.0–46.0)
Lymphs Abs: 1.8 10*3/uL (ref 0.7–4.0)
MCHC: 33.7 g/dL (ref 30.0–36.0)
MCV: 94.2 fl (ref 78.0–100.0)
Monocytes Absolute: 0.5 10*3/uL (ref 0.1–1.0)
Monocytes Relative: 6.7 % (ref 3.0–12.0)
Neutro Abs: 4.4 10*3/uL (ref 1.4–7.7)
Neutrophils Relative %: 64.8 % (ref 43.0–77.0)
Platelets: 203 10*3/uL (ref 150.0–400.0)
RBC: 4.28 Mil/uL (ref 3.87–5.11)
RDW: 12.7 % (ref 11.5–15.5)
WBC: 6.9 10*3/uL (ref 4.0–10.5)

## 2022-01-01 NOTE — Progress Notes (Signed)
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  She had a colonoscopy and was told she had lymphocytic colitis, and was treated with WelChol.  She took this for some time until she finally weaned off it and says symptoms were improved.  She has always had "stomach issues" with some sensitivities to food or stress causing bloating or diarrhea.  She does not recall if she was tested for celiac at that time.  She has had recurrence of similar symptoms with generalized abdominal discomfort, bloating and urgent loose stool with mucus.  This began in the third trimester of pregnancy and has persisted since delivering her second child in July.  Some nights she needs to get up 2 or 3 times due to the diarrhea, she has a feeling of rectal pressure without rectal bleeding.  Episodes of diarrhea sometimes cause a facial flushing without lip or tongue swelling or rash.  She has not noticed any specific food triggers, and did not improve after eliminating milk. Lastly, she is losing weight more than she expected to after childbirth and while breast-feeding.  ROS:  Review of Systems  Constitutional:  Negative for appetite change and unexpected weight change.  HENT:  Negative for mouth sores and voice change.   Eyes:  Negative for pain and redness.  Respiratory:  Negative for cough and shortness of breath.   Cardiovascular:  Negative for chest pain and palpitations.   Genitourinary:  Negative for dysuria and hematuria.  Musculoskeletal:  Negative for arthralgias and myalgias.  Skin:  Negative for pallor and rash.       She has a new lower leg skin lesion that will be evaluated by dermatology soon.  Neurological:  Negative for weakness and headaches.  Hematological:  Negative for adenopathy.   Episodic heartburn and nausea Fatigue and brain fog  Past Medical History: Past Medical History:  Diagnosis Date   Lymphocytic colitis    Melanoma ([place])    Rhinitis      Past Surgical History: Past Surgical History:  Procedure Laterality Date   DENTAL SURGERY     Dental implants   MELANOMA EXCISION       Family History: Family History  Problem Relation Age of Onset   Hypertension Father     Social History: Social History   Socioeconomic History   Marital status: Married    Spouse name: Not on file   Number of children: Not on file   Years of education: Not on file   Highest education level: Not on file  Occupational History   Not on file  Tobacco Use   Smoking status: Never   Smokeless tobacco: Never  Vaping Use   Vaping Use: Never used  Substance and Sexual Activity   Alcohol use: Not Currently   Drug use: Never   Sexual activity: Yes    Birth control/protection: None  Other Topics Concern   Not on file  Social History Narrative   Not on file   Social Determinants of Health   Financial Resource Strain: Not  on file  Food Insecurity: Not on file  Transportation Needs: Not on file  Physical Activity: Not on file  Stress: Not on file  Social Connections: Not on file    Allergies: Allergies  Allergen Reactions   Phenergan [Promethazine Hcl] Anaphylaxis    Outpatient Meds: Current Outpatient Medications  Medication Sig Dispense Refill   CAMILA 0.35 MG tablet Take 1 tablet by mouth daily.     Prenatal Vit-Fe Fumarate-FA (PRENATAL VITAMINS PO) Take by mouth.     No current facility-administered medications for this  visit.      ___________________________________________________________________ Objective   Exam:  BP 100/78   Pulse 75   Ht '5\' 7"'$  (1.702 m)   Wt 171 lb 6 oz (77.7 kg)   BMI 26.84 kg/m  Wt Readings from Last 3 Encounters:  01/01/22 171 lb 6 oz (77.7 kg)  10/01/21 185 lb 4.8 oz (84.1 kg)    General: Well-appearing Eyes: sclera anicteric, no redness ENT: oral mucosa moist without lesions, no cervical or supraclavicular lymphadenopathy CV: Regular without murmur, no JVD, no peripheral edema Resp: clear to auscultation bilaterally, normal RR and effort noted GI: soft, no tenderness, with active bowel sounds. No guarding or palpable organomegaly noted. Skin; warm and dry, no rash or jaundice noted Neuro: awake, alert and oriented x 3. Normal gross motor function and fluent speech  Labs:     Latest Ref Rng & Units 10/02/2021    4:56 AM 10/01/2021    6:49 AM  CBC  WBC 4.0 - 10.5 K/uL 16.8  11.3   Hemoglobin 12.0 - 15.0 g/dL 13.1  13.5   Hematocrit 36.0 - 46.0 % 38.9  39.8   Platelets 150 - 400 K/uL 167  194    She believes her thyroid function was checked shortly before around the time of her recent delivery.    Assessment: Encounter Diagnoses  Name Primary?   Chronic diarrhea Yes   Abdominal bloating    Tenesmus     Primarily lower digestive symptoms with a reported history of lymphocytic colitis, which is an uncommon diagnosis in a young person.  Unknown if she has previously been tested for sprue. No risk factors for EPI or SIBO. The nocturnal diarrhea and greater duration of symptoms that she previously experienced when this problem flared suggested may not be simple IBS. Facial flushing with some episodes is curious, doubtful carcinoid syndrome.  She does not get hives to suggest allergic or mast cell related condition.  Plan:  Colonoscopy and EGD.  (Evaluate for IBD, biopsies for microscopic colitis, celiac sprue and eosinophilic conditions) She was agreeable  after discussion of procedure and risks.  The benefits and risks of the planned procedure were described in detail with the patient or (when appropriate) their health care proxy.  Risks were outlined as including, but not limited to, bleeding, infection, perforation, adverse medication reaction leading to cardiac or pulmonary decompensation, pancreatitis (if ERCP).  The limitation of incomplete mucosal visualization was also discussed.  No guarantees or warranties were given.  Celiac antibodies and CBC with differential today  We had considered antispasmodic medicine such as Levsin, but she opted not to since she is breast-feeding for another few months. She will continue to use Imodium as needed.   Nelida Meuse III

## 2022-01-01 NOTE — Patient Instructions (Signed)
_______________________________________________________  If you are age 35 or older, your body mass index should be between 23-30. Your Body mass index is 26.84 kg/m. If this is out of the aforementioned range listed, please consider follow up with your Primary Care Provider.  If you are age 65 or younger, your body mass index should be between 19-25. Your Body mass index is 26.84 kg/m. If this is out of the aformentioned range listed, please consider follow up with your Primary Care Provider.   ________________________________________________________  The [facility] providers would like to encourage you to use [facility] to communicate with providers for non-urgent requests or questions.  Due to long hold times on the telephone, sending your provider a message by [facility] may be a faster and more efficient way to get a response.  Please allow 48 business hours for a response.  Please remember that this is for non-urgent requests.  _______________________________________________________  Your provider has requested that you go to the basement level for lab work before leaving today. Press "B" on the elevator. The lab is located at the first door on the left as you exit the elevator.  Due to recent changes in healthcare laws, you may see the results of your imaging and laboratory studies on MyChart before your provider has had a chance to review them.  We understand that in some cases there may be results that are confusing or concerning to you. Not all laboratory results come back in the same time frame and the provider may be waiting for multiple results in order to interpret others.  Please give [place] 48 hours in order for your provider to thoroughly review all the results before contacting the office for clarification of your results.   It has been recommended to you by your physician that you have a(n) Colonoscopy completed. Per your request, we did not schedule the procedure(s) today. Please contact  our office at [phone] should you decide to have the procedure completed. You will be scheduled for a pre-visit and procedure at that time.   It was a pleasure to see you today!  Thank you for trusting me with your gastrointestinal care!

## 2022-01-02 ENCOUNTER — Telehealth: Payer: Self-pay | Admitting: Gastroenterology

## 2022-01-02 LAB — IGA: Immunoglobulin A: 182 mg/dL (ref 47–310)

## 2022-01-02 LAB — TISSUE TRANSGLUTAMINASE, IGA: (tTG) Ab, IgA: <1 U/mL

## 2022-01-02 NOTE — Telephone Encounter (Signed)
Incoming call from patient. Advised patient she could go ahead and schedule procedure. Patient states she will speak to her husband about scheduling for November Patient will call back. If needing to further advise Please give patient a call .  Thank you

## 2022-01-02 NOTE — Telephone Encounter (Signed)
Inbound call from patient requesting to speak regarding a ENDO,states that Dr. Loletha Carrow told her to get in touch with you . Please advise

## 2022-01-02 NOTE — Telephone Encounter (Signed)
Patient is requesting for you to talk with her. The dates and times that were available to her yesterday can not be scheduled today for both procedures as she needs an hour not 30 minutes.

## 2022-01-03 ENCOUNTER — Other Ambulatory Visit: Payer: Self-pay

## 2022-01-03 DIAGNOSIS — K529 Noninfective gastroenteritis and colitis, unspecified: Secondary | ICD-10-CM

## 2022-01-03 DIAGNOSIS — R12 Heartburn: Secondary | ICD-10-CM

## 2022-01-03 DIAGNOSIS — R198 Other specified symptoms and signs involving the digestive system and abdomen: Secondary | ICD-10-CM

## 2022-01-03 DIAGNOSIS — R140 Abdominal distension (gaseous): Secondary | ICD-10-CM

## 2022-01-03 MED ORDER — NA SULFATE-K SULFATE-MG SULF 17.5-3.13-1.6 GM/177ML PO SOLN: 1.0000 | Freq: Once | ORAL | 0 refills | Status: AC

## 2022-01-03 NOTE — Telephone Encounter (Signed)
Patient has been scheduled for 01-16-2022 per Dr Loletha Carrow. Instructions and prep have been sent to the patient.

## 2022-01-04 ENCOUNTER — Encounter: Payer: Self-pay | Admitting: Gastroenterology

## 2022-01-16 ENCOUNTER — Encounter: Payer: Self-pay | Admitting: Gastroenterology

## 2022-01-16 ENCOUNTER — Encounter: Payer: BC Managed Care – PPO | Admitting: Gastroenterology

## 2022-01-16 ENCOUNTER — Ambulatory Visit (AMBULATORY_SURGERY_CENTER): Payer: BC Managed Care – PPO | Admitting: Gastroenterology

## 2022-01-16 VITALS — BP 112/64 | HR 77 | Temp 98.0°F | Resp 14 | Ht 67.0 in | Wt 171.0 lb

## 2022-01-16 DIAGNOSIS — K52832 Lymphocytic colitis: Secondary | ICD-10-CM | POA: Diagnosis present

## 2022-01-16 DIAGNOSIS — R140 Abdominal distension (gaseous): Secondary | ICD-10-CM | POA: Diagnosis not present

## 2022-01-16 DIAGNOSIS — R634 Abnormal weight loss: Secondary | ICD-10-CM

## 2022-01-16 DIAGNOSIS — K6389 Other specified diseases of intestine: Secondary | ICD-10-CM | POA: Diagnosis not present

## 2022-01-16 DIAGNOSIS — R197 Diarrhea, unspecified: Secondary | ICD-10-CM | POA: Diagnosis not present

## 2022-01-16 DIAGNOSIS — K529 Noninfective gastroenteritis and colitis, unspecified: Secondary | ICD-10-CM

## 2022-01-16 LAB — POCT URINE PREGNANCY: Preg Test, Ur: NEGATIVE

## 2022-01-16 MED ORDER — SODIUM CHLORIDE 0.9 % IV SOLN: 500.0000 mL | Freq: Once | INTRAVENOUS | Status: DC

## 2022-01-16 NOTE — Op Note (Signed)
[facility] Patient Name: Sarah Mccall Procedure Date: 01/16/2022 3:54 PM MRN: [id number] Endoscopist: [place]. Loletha Carrow , MD Age: 36 Referring MD:  Date of Birth: 06-23-1985 Gender: Female Account #: [id number] Procedure:                Upper GI endoscopy Indications:              Abdominal bloating, Diarrhea, Weight loss                           recent negative tTG Ab Medicines:                Monitored Anesthesia Care Procedure:                Pre-Anesthesia Assessment:                           - Prior to the procedure, a History and Physical                            was performed, and patient medications and                            allergies were reviewed. The patient's tolerance of                            previous anesthesia was also reviewed. The risks                            and benefits of the procedure and the sedation                            options and risks were discussed with the patient.                            All questions were answered, and informed consent                            was obtained. Prior Anticoagulants: The patient has                            taken no previous anticoagulant or antiplatelet                            agents. ASA Grade Assessment: II - A patient with                            mild systemic disease. After reviewing the risks                            and benefits, the patient was deemed in                            satisfactory condition to undergo the procedure.  After obtaining informed consent, the endoscope was                            passed under direct vision. Throughout the                            procedure, the patient's blood pressure, pulse, and                            oxygen saturations were monitored continuously. The                            Endoscope was introduced through the mouth, and                            advanced to the second part of duodenum.  The upper                            GI endoscopy was accomplished without difficulty.                            The patient tolerated the procedure well. Scope In: Scope Out: Findings:                 The larynx was normal.                           The esophagus was normal.                           The stomach was normal.                           The cardia and gastric fundus were normal on                            retroflexion.                           Normal mucosa was found in the entire duodenum. Six                            biopsies for histology were taken with a cold                            forceps for evaluation of celiac disease and other                            causes of malabsorption. Complications:            No immediate complications. Estimated Blood Loss:     Estimated blood loss was minimal. Impression:               - Normal larynx.                           - Normal esophagus.                           -  Normal stomach.                           - Normal mucosa was found in the entire examined                            duodenum. Biopsied. Recommendation:           - Patient has a contact number available for                            emergencies. The signs and symptoms of potential                            delayed complications were discussed with the                            patient. Return to normal activities tomorrow.                            Written discharge instructions were provided to the                            patient.                           - Resume previous diet.                           - Continue present medications.                           - Await pathology results.                           - See the other procedure note for documentation of                            additional recommendations. Eliabeth Shoff L. Loletha Carrow, MD 01/16/2022 4:28:38 PM This report has been signed electronically.

## 2022-01-16 NOTE — Progress Notes (Unsigned)
No changes to clinical history since GI office visit on 01/01/22.  The patient is appropriate for an endoscopic procedure in the ambulatory setting.  - Salmaan Patchin Danis, MD    

## 2022-01-16 NOTE — Progress Notes (Unsigned)
A/ox3, pleased with MAC, report to RN 

## 2022-01-16 NOTE — Progress Notes (Signed)
Pt's states no medical or surgical changes since previsit or office visit. 

## 2022-01-16 NOTE — Op Note (Signed)
[place] Patient Name: Sarah Mccall Procedure Date: 01/16/2022 3:50 PM MRN: [id number] Endoscopist: Mallie Mussel L. Loletha Carrow , MD Age: 36 Referring MD:  Date of Birth: 1985/05/17 Gender: Female Account #: [id number] Procedure:                Colonoscopy Indications:              Chronic diarrhea, Weight loss, bloating                           prior Hx lymphocytic colitis (see recent office                            consult note for clinical details) Medicines:                Monitored Anesthesia Care Procedure:                Pre-Anesthesia Assessment:                           - Prior to the procedure, a History and Physical                            was performed, and patient medications and                            allergies were reviewed. The patient's tolerance of                            previous anesthesia was also reviewed. The risks                            and benefits of the procedure and the sedation                            options and risks were discussed with the patient.                            All questions were answered, and informed consent                            was obtained. Prior Anticoagulants: The patient has                            taken no previous anticoagulant or antiplatelet                            agents. ASA Grade Assessment: II - A patient with                            mild systemic disease. After reviewing the risks                            and benefits, the patient was deemed in  satisfactory condition to undergo the procedure.                           After obtaining informed consent, the colonoscope                            was passed under direct vision. Throughout the                            procedure, the patient's blood pressure, pulse, and                            oxygen saturations were monitored continuously. The                            Olympus Colonoscope [id number] was  introduced through                            the anus and advanced to the the terminal ileum,                            with identification of the appendiceal orifice and                            IC valve. The [id number]-[id number] Colonoscope was introduced                            through the and advanced to the. The colonoscopy                            was performed without difficulty. The patient                            tolerated the procedure well. The quality of the                            bowel preparation was excellent. The terminal                            ileum, ileocecal valve, appendiceal orifice, and                            rectum were photographed. The bowel preparation                            used was SUPREP. Scope In: 4:09:00 PM Scope Out: 4:23:32 PM Scope Withdrawal Time: 0 hours 11 minutes 39 seconds  Total Procedure Duration: 0 hours 14 minutes 32 seconds  Findings:                 The perianal and digital rectal examinations were                            normal.  The terminal ileum appeared normal.(intubated at                            least 10cm)                           Normal mucosa was found in the entire colon.                            Biopsies for histology were taken with a cold                            forceps from the ascending colon, transverse colon                            and descending colon for evaluation of microscopic                            colitis.                           The entire examined colon appeared normal on direct                            and retroflexion views. Complications:            No immediate complications. Estimated Blood Loss:     Estimated blood loss was minimal. Impression:               - The examined portion of the ileum was normal.                           - Normal mucosa in the entire examined colon.                            Biopsied.                           - The  entire examined colon is normal on direct and                            retroflexion views. Recommendation:           - Patient has a contact number available for                            emergencies. The signs and symptoms of potential                            delayed complications were discussed with the                            patient. Return to normal activities tomorrow.                            Written discharge instructions were provided to the  patient.                           - Resume previous diet.                           - Continue present medications.                           - Await pathology results.                           - No recommendation at this time regarding repeat                            colonoscopy.                           - See the other procedure note for documentation of                            additional recommendations.                           - As-needed use of imodium. (patient has elected                            not to try antispasmodic at present due to                            breastfeeding) Mallie Mussel L. Loletha Carrow, MD 01/16/2022 4:33:13 PM This report has been signed electronically.

## 2022-01-16 NOTE — Patient Instructions (Signed)
Please read handouts provided. Continue present medications. Await pathology results.    YOU HAD AN ENDOSCOPIC PROCEDURE TODAY AT THE [facility]:   Refer to the procedure report that was given to you for any specific questions about what was found during the examination.  If the procedure report does not answer your questions, please call your gastroenterologist to clarify.  If you requested that your care partner not be given the details of your procedure findings, then the procedure report has been included in a sealed envelope for you to review at your convenience later.  YOU SHOULD EXPECT: Some feelings of bloating in the abdomen. Passage of more gas than usual.  Walking can help get rid of the air that was put into your GI tract during the procedure and reduce the bloating. If you had a lower endoscopy (such as a colonoscopy or flexible sigmoidoscopy) you may notice spotting of blood in your stool or on the toilet paper. If you underwent a bowel prep for your procedure, you may not have a normal bowel movement for a few days.  Please Note:  You might notice some irritation and congestion in your nose or some drainage.  This is from the oxygen used during your procedure.  There is no need for concern and it should clear up in a day or so.  SYMPTOMS TO REPORT IMMEDIATELY:  Following lower endoscopy (colonoscopy or flexible sigmoidoscopy):  Excessive amounts of blood in the stool  Significant tenderness or worsening of abdominal pains  Swelling of the abdomen that is new, acute  Fever of 100F or higher  Following upper endoscopy (EGD)  Vomiting of blood or coffee ground material  New chest pain or pain under the shoulder blades  Painful or persistently difficult swallowing  New shortness of breath  Fever of 100F or higher  Black, tarry-looking stools  For urgent or emergent issues, a gastroenterologist can be reached at any hour by calling [phone]. Do not use  MyChart messaging for urgent concerns.    DIET:  We do recommend a small meal at first, but then you may proceed to your regular diet.  Drink plenty of fluids but you should avoid alcoholic beverages for 24 hours.  ACTIVITY:  You should plan to take it easy for the rest of today and you should NOT DRIVE or use heavy machinery until tomorrow (because of the sedation medicines used during the test).    FOLLOW UP: Our staff will call the number listed on your records the next business day following your procedure.  We will call around 7:15- 8:00 am to check on you and address any questions or concerns that you may have regarding the information given to you following your procedure. If we do not reach you, we will leave a message.     If any biopsies were taken you will be contacted by phone or by letter within the next 1-3 weeks.  Please call us at [phone] if you have not heard about the biopsies in 3 weeks.    SIGNATURES/CONFIDENTIALITY: You and/or your care partner have signed paperwork which will be entered into your electronic medical record.  These signatures attest to the fact that that the information above on your After Visit Summary has been reviewed and is understood.  Full responsibility of the confidentiality of this discharge information lies with you and/or your care-partner. 

## 2022-01-16 NOTE — Progress Notes (Unsigned)
Vaishnavi Dalby CRNA relieves [employer]

## 2022-01-16 NOTE — Progress Notes (Signed)
Called to room to assist during endoscopic procedure.  Patient ID and intended procedure confirmed with present staff. Received instructions for my participation in the procedure from the performing physician.  

## 2022-01-17 ENCOUNTER — Telehealth: Payer: Self-pay

## 2022-01-17 NOTE — Telephone Encounter (Signed)
  Follow up Call-     01/16/2022    2:58 PM  Call back number  Post procedure Call Back phone  # [phone]  Permission to leave phone message Yes     Patient questions:  Do you have a fever, pain , or abdominal swelling? No. Pain Score  0 *  Have you tolerated food without any problems? Yes.    Have you been able to return to your normal activities? Yes.    Do you have any questions about your discharge instructions: Diet   No. Medications  No. Follow up visit  No.  Do you have questions or concerns about your Care? No.  Actions: * If pain score is 4 or above: No action needed, pain <4.

## 2022-01-22 ENCOUNTER — Encounter: Payer: Self-pay | Admitting: Gastroenterology

## 2022-01-22 MED ORDER — BUDESONIDE 3 MG PO CPEP: 9.0000 mg | ORAL_CAPSULE | Freq: Every day | ORAL | 1 refills | Status: DC

## 2022-02-14 ENCOUNTER — Other Ambulatory Visit: Payer: Self-pay | Admitting: Gastroenterology

## 2022-06-07 ENCOUNTER — Encounter: Payer: Self-pay | Admitting: Gastroenterology

## 2022-08-26 ENCOUNTER — Other Ambulatory Visit: Payer: Self-pay | Admitting: Gastroenterology

## 2022-10-31 ENCOUNTER — Encounter: Payer: Self-pay | Admitting: Gastroenterology

## 2022-10-31 DIAGNOSIS — K529 Noninfective gastroenteritis and colitis, unspecified: Secondary | ICD-10-CM

## 2022-10-31 MED ORDER — BUDESONIDE 3 MG PO CPEP: 3.0000 mg | ORAL_CAPSULE | Freq: Every day | ORAL | 1 refills | Status: DC

## 2022-11-12 NOTE — Telephone Encounter (Signed)
Thanks for taking this message while I was out of the office.  If this diarrhea is from her known microscopic colitis, I would have hoped for improvement by now.  Might still take time or we need to add another medicine.  Can't go up on budesonide dose.  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  I am out of the office this week on hospital inpatient service.   Please arrange a C difficile PCR stool study this week.  Prn imodium ok, especially before bedtime.  - HD

## 2022-11-12 NOTE — Addendum Note (Signed)
Addended by: Missy Sabins on: 11/12/2022 08:40 AM   Modules accepted: Orders

## 2022-11-22 ENCOUNTER — Encounter: Payer: Self-pay | Admitting: Gastroenterology

## 2022-11-22 ENCOUNTER — Other Ambulatory Visit: Payer: BC Managed Care – PPO

## 2022-11-22 DIAGNOSIS — K529 Noninfective gastroenteritis and colitis, unspecified: Secondary | ICD-10-CM

## 2022-11-22 DIAGNOSIS — K52839 Microscopic colitis, unspecified: Secondary | ICD-10-CM

## 2022-11-22 NOTE — Telephone Encounter (Signed)
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  If she does not take Imodium a few times a day she will have 2-4 loose stools, and she is still often having 2-3 episodes of overnight diarrhea if she does not take Imodium at bedtime.  No fever or bloody stool, no sick household contacts.  Husband and children do not have diarrhea.  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  No apparent risk factors for EPI or SIBO.  Plan as follows:  1 - Please contact the lab and change her stool study order from just the C. difficile studies to a full [facility] GI pathogen panel.  She asked if different specimen containers were needed because she has not yet brought the specimen and, but I do not know the answer to that.  Please find that out and contact her with the arrangements.  2 - She will stop taking Entocort but will continue Imodium since it is working well, even though the diarrhea recurs when she periodically holds the medication.  3 - Please send a prescription for Uceris 9 mg once daily to start as soon as it is available.  If the pharmacy wants to switch to the generic Ortikos at the same dose, that is acceptable.  If it is prohibitively costly, she can shop it around to some other pharmacies.  This form of budesonide might work better than the Entocort that primarily works within the right colon.   - H. Danis

## 2022-11-23 ENCOUNTER — Other Ambulatory Visit: Payer: BC Managed Care – PPO

## 2022-11-23 ENCOUNTER — Other Ambulatory Visit: Payer: Self-pay | Admitting: Gastroenterology

## 2022-11-23 DIAGNOSIS — K52839 Microscopic colitis, unspecified: Secondary | ICD-10-CM

## 2022-11-23 DIAGNOSIS — K529 Noninfective gastroenteritis and colitis, unspecified: Secondary | ICD-10-CM

## 2022-11-23 MED ORDER — BUDESONIDE ER 9 MG PO TB24: 1.0000 | ORAL_TABLET | Freq: Every day | ORAL | 2 refills | Status: DC

## 2022-11-23 NOTE — Addendum Note (Signed)
Addended by: Missy Sabins on: 11/23/2022 09:41 AM   Modules accepted: Orders

## 2022-11-24 LAB — CLOSTRIDIUM DIFFICILE BY PCR: Toxigenic C. Difficile by PCR: NEGATIVE

## 2022-11-27 ENCOUNTER — Other Ambulatory Visit (HOSPITAL_COMMUNITY): Payer: Self-pay

## 2022-11-27 ENCOUNTER — Telehealth: Payer: Self-pay

## 2022-11-27 NOTE — Telephone Encounter (Signed)
PA request has been Submitted. New Encounter created for follow up. For additional info see Pharmacy Prior Auth telephone encounter from 09/03.

## 2022-11-27 NOTE — Telephone Encounter (Signed)
*  Gastro  Pharmacy Patient Advocate Encounter   Received notification from CoverMyMeds that prior authorization for Budesonide ER 9MG  er tablets  is required/requested.   Insurance verification completed.   The patient is insured through [facility] .   Per test claim: PA required; PA submitted to [employer] via CoverMyMeds Key/confirmation #/EOC [id number] Status is pending

## 2022-11-28 NOTE — Telephone Encounter (Signed)
Additional info PA form attached to patients media.

## 2022-11-28 NOTE — Telephone Encounter (Signed)
PA form completed and faxed to [facility]

## 2022-11-29 NOTE — Telephone Encounter (Signed)
Inbound call from [facility] regarding an update for prior auth. States prior Berkley Harvey has been denied and will be faxing a detailed denial letter. Please advise, thank you.

## 2022-12-03 ENCOUNTER — Encounter: Payer: Self-pay | Admitting: Gastroenterology

## 2022-12-03 NOTE — Telephone Encounter (Signed)
Thank you for the note.  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  Thank you   - HD

## 2022-12-03 NOTE — Telephone Encounter (Signed)
Pharmacy Patient Advocate Encounter  Received notification from [facility] that Prior Authorization for Budesonide ER 9MG  er tablets has been DENIED.  Full denial letter will be uploaded to the media tab. See denial reason below.  This medication is not on the formulary. The member must try and fail (did not work), or be unable to take ALL formulary alternatives (due to interactions, side effects, etc.). In this case, other formulary alternatives are available for the member to take. Alternative medications include (please refer to member's formulary): generic Budesonide DR 3 mg capsule, mesalamine 800 mg DR tablet, mesalamine 1.2 gm tablet, mesalamine enema, mesalamine suppository, etc.  PA #/Case ID/Reference #: [id number]  Please be advised we currently do not have a Pharmacist to review denials, therefore you will need to process appeals accordingly as needed. Thanks for your support at this time. Contact for appeals are as follows: Phone: [phone], Fax: [phone]  Last day to appeal is 180 Calendar Days of the date of letter (11-29-2022)

## 2022-12-04 MED ORDER — MESALAMINE 1.2 G PO TBEC: 3.6000 g | DELAYED_RELEASE_TABLET | Freq: Every day | ORAL | 1 refills | Status: DC

## 2022-12-31 ENCOUNTER — Other Ambulatory Visit: Payer: Self-pay | Admitting: Gastroenterology

## 2023-01-29 ENCOUNTER — Other Ambulatory Visit: Payer: Self-pay | Admitting: Gastroenterology

## 2023-01-29 ENCOUNTER — Encounter: Payer: Self-pay | Admitting: Gastroenterology

## 2023-01-29 DIAGNOSIS — K52839 Microscopic colitis, unspecified: Secondary | ICD-10-CM

## 2023-01-29 DIAGNOSIS — K529 Noninfective gastroenteritis and colitis, unspecified: Secondary | ICD-10-CM

## 2023-02-04 MED ORDER — BUDESONIDE ER 9 MG PO TB24
1.0000 | ORAL_TABLET | Freq: Every day | ORAL | 2 refills | Status: DC
Start: 2023-02-04 — End: 2023-08-16

## 2023-03-07 ENCOUNTER — Other Ambulatory Visit: Payer: Self-pay | Admitting: Gastroenterology

## 2023-03-12 ENCOUNTER — Encounter: Payer: Self-pay | Admitting: Gastroenterology

## 2023-04-01 ENCOUNTER — Other Ambulatory Visit: Payer: Self-pay | Admitting: Gastroenterology

## 2023-07-04 IMAGING — US US MFM OB DETAIL+14 WK
1 series · 13 of 28 positions shown · non-contrast
Comparison: none

[Series 1: us mfm ob detail+14 wk · 110 acquisitions, 13 frames shown]
[im 5/110]
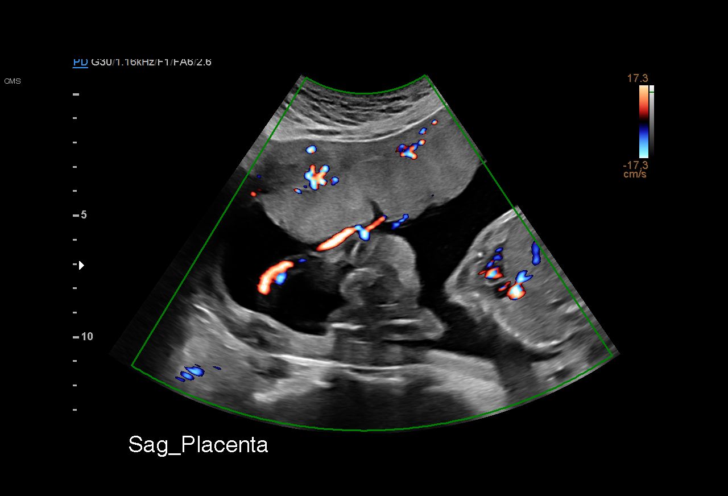
[im 13/110]
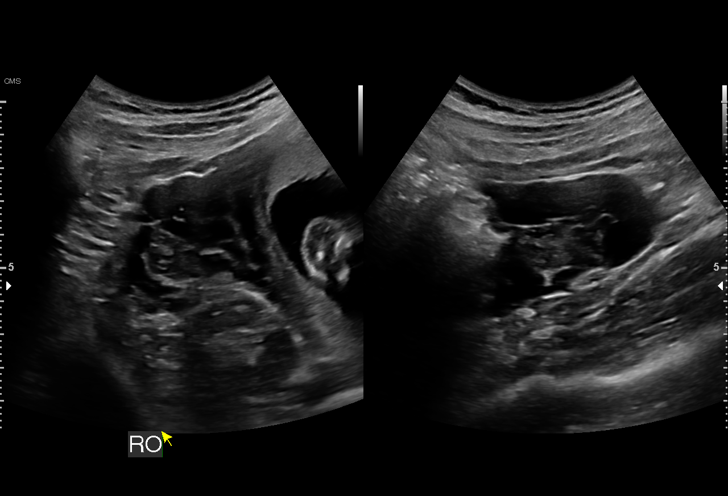
[im 21/110]
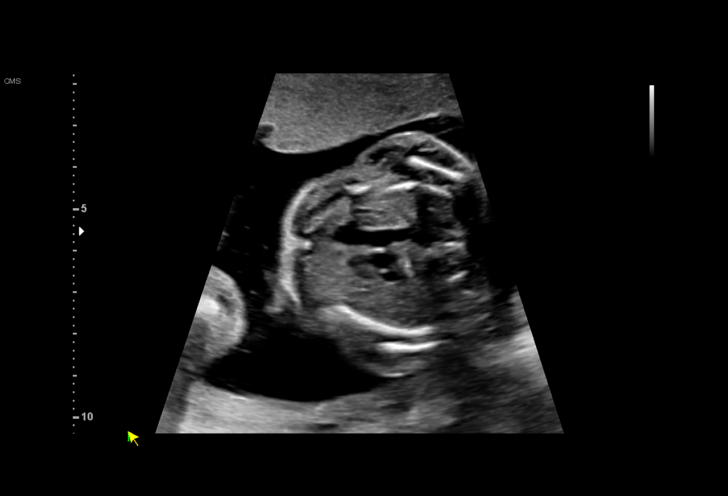
[im 29/110]
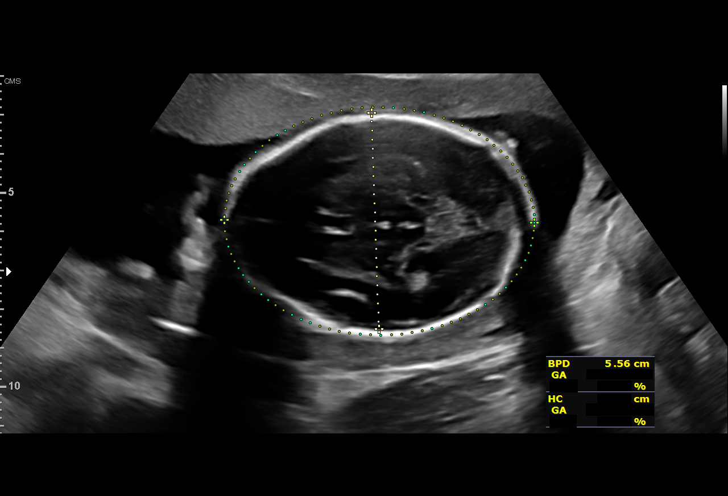
[im 37/110]
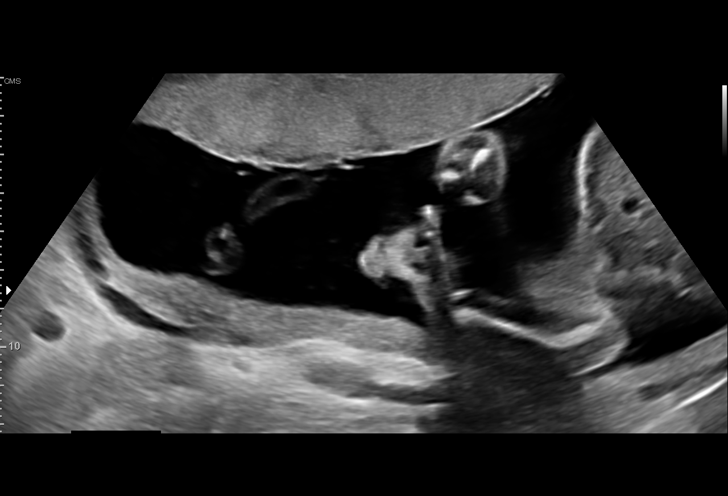
[im 45/110]
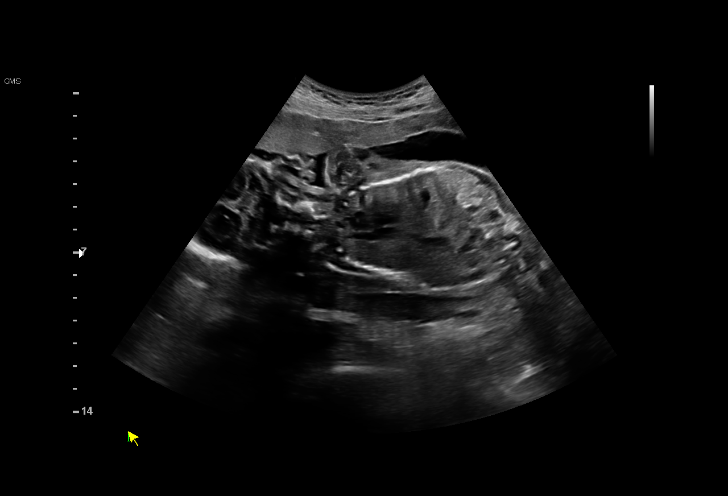
[im 57/110]
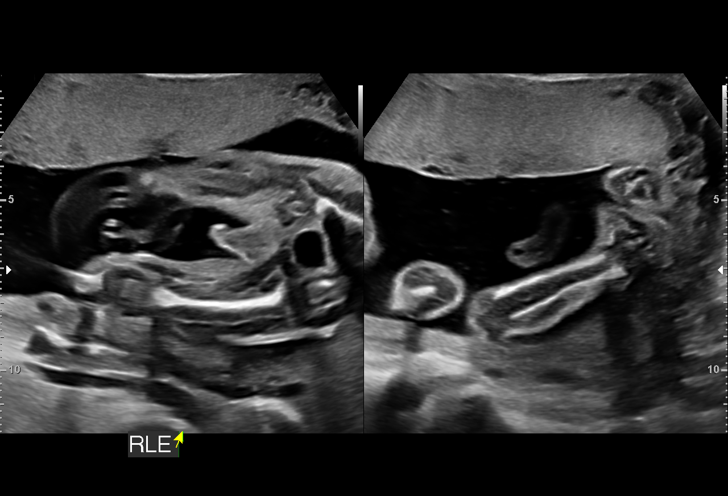
[im 65/110]
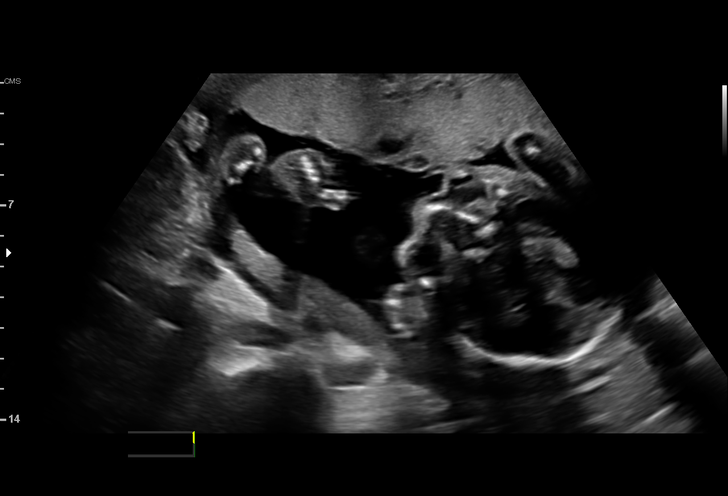
[im 73/110]
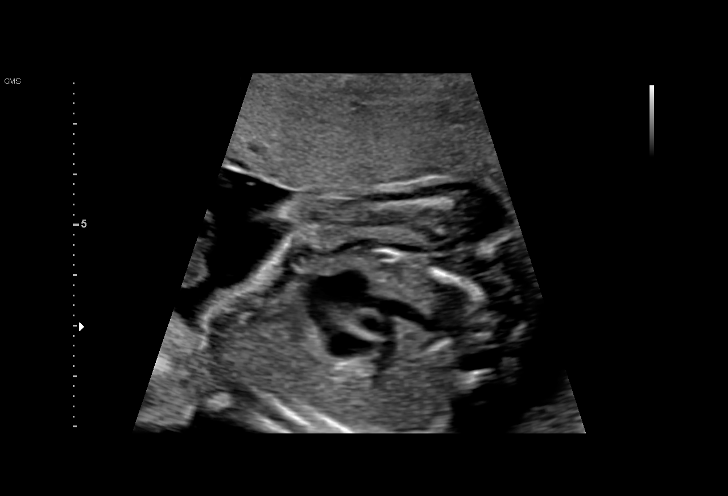
[im 81/110]
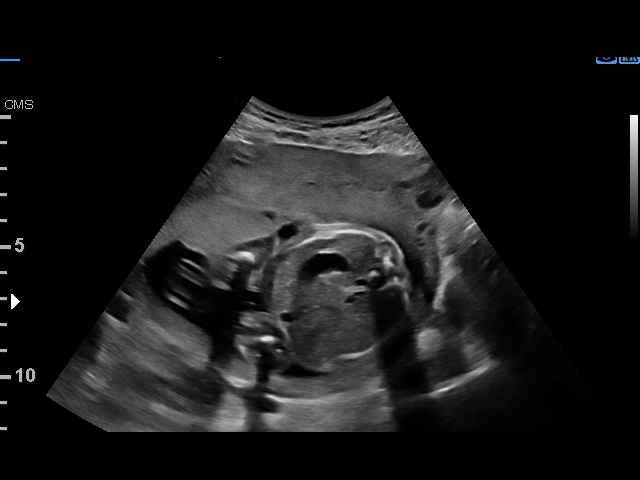
[im 89/110]
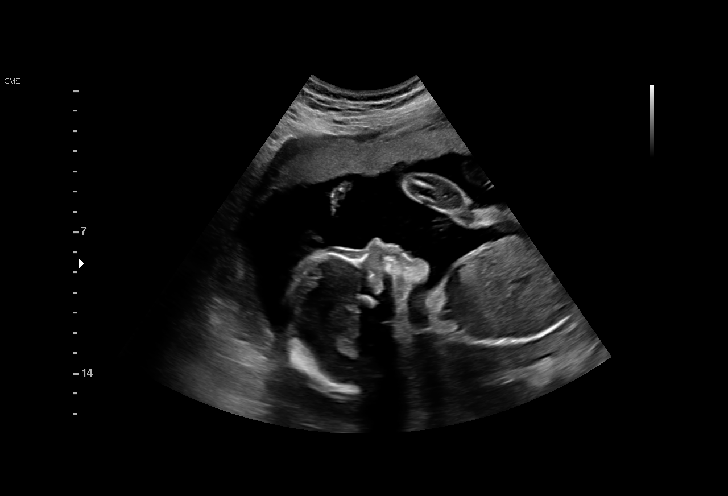
[im 97/110]
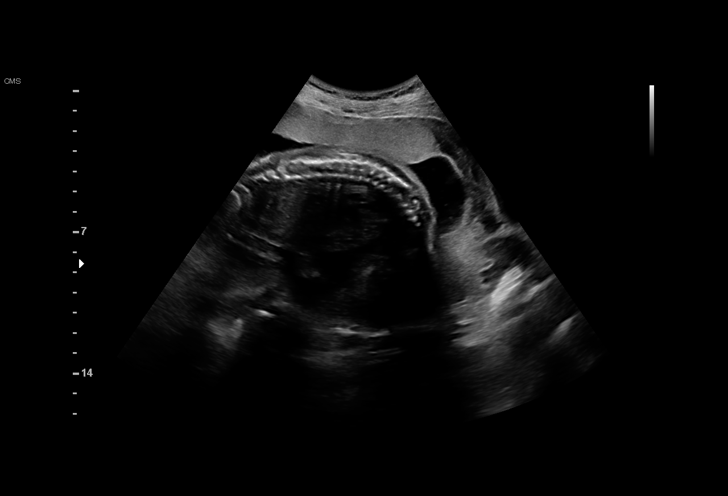
[im 105/110]
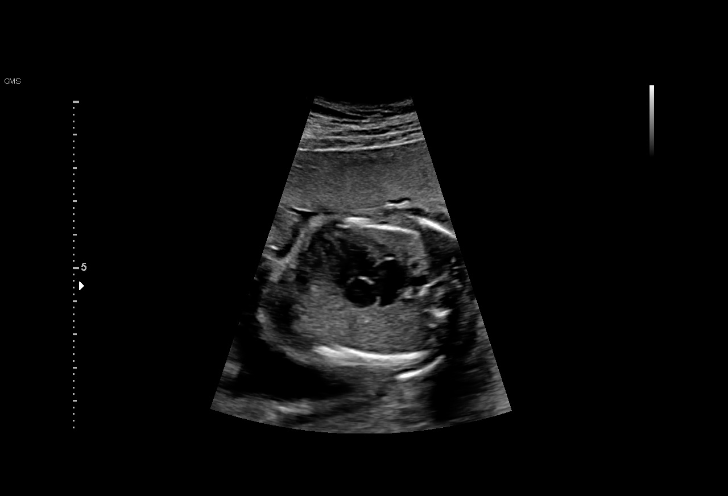

[13 of 28 positions shown; findings below may reference images not displayed]

[REDACTED]. [HOSPITAL]
                   DO

Indications

 23 weeks gestation of pregnancy
 Encounter for antenatal screening for
 malformations
 Fetal abnormality - other known or
 suspected ([facility])
 Advanced maternal age multigravida 35+,
 second trimester (36 by delivery)
 Low risk NIPS, neg AFP/Horizon
Fetal Evaluation

 Num Of Fetuses:         1
 Fetal Heart Rate(bpm):  147
 Cardiac Activity:       Observed
 Presentation:           Breech
 Placenta:               Anterior
 P. Cord Insertion:      Visualized

 Amniotic Fluid
 AFI FV:      Within normal limits

                             Largest Pocket(cm)

Biometry

 BPD:      55.4  mm     G. Age:  22w 6d         30  %    CI:        66.64   %    70 - 86
                                                         FL/HC:      17.6   %    19.2 -
 HC:      217.5  mm     G. Age:  23w 6d         55  %    HC/AC:      1.12        1.05 -
 AC:      194.1  mm     G. Age:  24w 1d         68  %    FL/BPD:     69.1   %    71 - 87
 FL:       38.3  mm     G. Age:  22w 2d         12  %    FL/AC:      19.7   %    20 - 24
 HUM:        37  mm     G. Age:  22w 6d         35  %
 CER:      24.6  mm     G. Age:  22w 4d         51  %

 LV:        8.2  mm
 CM:        5.4  mm

 Est. FW:     585  gm      1 lb 5 oz     45  %
OB History

 Gravidity:    2         Term:   1        Prem:   0        SAB:   0
 TOP:          0       Ectopic:  0        Living: 1
Gestational Age

 LMP:           23w 2d        Date:  12/25/20                 EDD:   10/01/21
 U/S Today:     23w 2d                                        EDD:   10/01/21
 Best:          23w 2d     Det. By:  LMP  (12/25/20)          EDD:   10/01/21
Anatomy

 Cranium:               Appears normal         LVOT:                   Appears normal
 Cavum:                 Appears normal         Aortic Arch:            Appears normal
 Ventricles:            Appears normal         Ductal Arch:            Appears normal
 Choroid Plexus:        Appears normal         Diaphragm:              Appears normal
 Cerebellum:            Appears normal         Stomach:                Appears normal, left
                                                                       sided
 Posterior Fossa:       Appears normal         Abdomen:                Appears normal
 Nuchal Fold:           Not applicable (>20    Abdominal Wall:         Appears nml (cord
                        wks GA)                                        insert, abd wall)
 Face:                  Appears normal         Cord Vessels:           Appears normal (3
                        (orbits and profile)                           vessel cord)
 Lips:                  Appears normal         Kidneys:                Appear normal
 Palate:                Appears normal         Bladder:                Appears normal
 Thoracic:              Appears normal         Spine:                  Appears normal
 Heart:                 Appears normal         Upper Extremities:      Appears normal
                        (4CH, axis, and
                        situs)
 RVOT:                  Appears normal         Lower Extremities:      Appears normal

 Other:  Heels/feet, open hands/5th digits, nasal bone, lenses, maxilla,
         mandible, falx, VC, 3VV and 3VTV visualized.
Cervix Uterus Adnexa

 Cervix
 Length:           3.83  cm.
 Normal appearance by transabdominal scan.

 Uterus
 No abnormality visualized.

 Right Ovary
 Within normal limits.

 Left Ovary
 Within normal limits.
 Cul De Sac
 No free fluid seen.

 Adnexa
 No abnormality visualized.
Impression

 MFM Brief Note

 Ms. Bfb is a 35 yo G2P1 at 23w 2d based on an EDD of
 10/01/21 consistent with LMP and 8 week scan.  She is being
 seen today at the request of Dr. Bejko Temur regarding
 ultrasound findings performed in her offices.

 She is overall doing well without s/sx of preterm labor or
 preeclampsia.

 Single intrauterine pregnancy here for a detailed anatomy
 due to an outside exam observing frontal bossing and choroid
 plexus cyst.
 Normal anatomy with measurements consistent with dates
 There is good fetal movement and amniotic fluid volume
 Suboptimal views of the fetal anatomy were obtained
 secondary to fetal position.

 I met with Ms. Bfb and reviewed the normal nature of
 today's exam.

 She has a low risk NIPS and AFP/Horizon.

 I explained that we did not observe a choroid plexus cyst nor
 did we observe frontal bossing. We also cleared the ductus
 arteriosus.

 I reviewed the association with frontal bossing to include
 skeletal dysplasia. I discussed the characteristics of lethal
 and non-lethal skeletal dysplasia, which include shortened
 long bones, small chest and possible frontal bossing and fetal
 growth restriction.

 Again we did not observe these findings today. We observed
 a normal profile, normal long bones, chest and normal growth.

 At this time no additional follow up is recommended.

 All questions answered.

 I spent 20 minutes with > 50% in face to face consultation.

## 2023-08-16 ENCOUNTER — Encounter: Payer: Self-pay | Admitting: Gastroenterology

## 2023-08-16 ENCOUNTER — Telehealth: Payer: Self-pay

## 2023-08-16 ENCOUNTER — Other Ambulatory Visit: Payer: Self-pay | Admitting: Gastroenterology

## 2023-08-16 DIAGNOSIS — K52839 Microscopic colitis, unspecified: Secondary | ICD-10-CM

## 2023-08-16 DIAGNOSIS — K529 Noninfective gastroenteritis and colitis, unspecified: Secondary | ICD-10-CM

## 2023-08-16 MED ORDER — BUDESONIDE ER 9 MG PO TB24
1.0000 | ORAL_TABLET | Freq: Every day | ORAL | 2 refills | Status: DC
Start: 2023-08-16 — End: 2023-10-09

## 2023-08-16 NOTE — Telephone Encounter (Signed)
 Pharmacy Patient Advocate Encounter   Received notification from CoverMyMeds that prior authorization for Budesonide  ER 9MG  er tablets is required/requested.   Insurance verification completed.   The patient is insured through [facility] .   Per test claim: Patient has not been seen since 2023 and must have new office visit for documentation of progress with medication.

## 2023-08-16 NOTE — Telephone Encounter (Signed)
 I spoke with the pt and made her aware that she will need an appt for PA of budesonide .  She states she will call and make f/u appt. She does mention that she had to pay cash for it the last time she picked it up and may do that again as well.   Sarah Mccall see the note in My Chart for documentation purposes.  She states Dr Dominic Friendly told her this would be in place of appt for this refill.

## 2023-08-22 ENCOUNTER — Other Ambulatory Visit (HOSPITAL_COMMUNITY): Payer: Self-pay

## 2023-08-23 ENCOUNTER — Telehealth: Payer: Self-pay

## 2023-08-23 NOTE — Telephone Encounter (Signed)
 MyChart message sent to patient.

## 2023-08-23 NOTE — Telephone Encounter (Signed)
 Pharmacy Patient Advocate Encounter   Received notification from CoverMyMeds that prior authorization for Budesonide  ER 9MG  er tablets is required/requested.   Insurance verification completed.   The patient is insured through [facility] .   Prior Authorization for Budesonide  ER 9MG  er tablets has been APPROVED from 08-22-2023 to 08-21-2024   PA #/Case ID/Reference #: [id number]

## 2023-10-09 ENCOUNTER — Other Ambulatory Visit: Payer: Self-pay | Admitting: Gastroenterology

## 2023-10-09 DIAGNOSIS — K529 Noninfective gastroenteritis and colitis, unspecified: Secondary | ICD-10-CM

## 2023-10-09 DIAGNOSIS — K52839 Microscopic colitis, unspecified: Secondary | ICD-10-CM

## 2023-11-06 ENCOUNTER — Other Ambulatory Visit: Payer: Self-pay | Admitting: Gastroenterology

## 2023-11-06 DIAGNOSIS — K529 Noninfective gastroenteritis and colitis, unspecified: Secondary | ICD-10-CM

## 2023-11-06 DIAGNOSIS — K52839 Microscopic colitis, unspecified: Secondary | ICD-10-CM

## 2023-11-12 ENCOUNTER — Other Ambulatory Visit: Payer: Self-pay | Admitting: Gastroenterology

## 2023-11-12 ENCOUNTER — Encounter: Payer: Self-pay | Admitting: Gastroenterology

## 2023-11-12 DIAGNOSIS — K52839 Microscopic colitis, unspecified: Secondary | ICD-10-CM

## 2023-11-12 DIAGNOSIS — K529 Noninfective gastroenteritis and colitis, unspecified: Secondary | ICD-10-CM

## 2023-12-23 ENCOUNTER — Other Ambulatory Visit (HOSPITAL_BASED_OUTPATIENT_CLINIC_OR_DEPARTMENT_OTHER): Payer: Self-pay

## 2023-12-23 MED ORDER — FLUZONE 0.5 ML IM SUSY
0.5000 mL | PREFILLED_SYRINGE | Freq: Once | INTRAMUSCULAR | 0 refills | Status: AC
  Filled 2023-12-23: qty 0.5, 1d supply, fill #0

## 2023-12-25 ENCOUNTER — Other Ambulatory Visit (HOSPITAL_BASED_OUTPATIENT_CLINIC_OR_DEPARTMENT_OTHER): Payer: Self-pay

## 2023-12-25 ENCOUNTER — Other Ambulatory Visit (HOSPITAL_COMMUNITY): Payer: Self-pay
